# Patient Record
Sex: Male | Born: 1960 | Race: White | Hispanic: No | Marital: Single | Smoking: Never smoker
Health system: Southern US, Community
[De-identification: ages and names within clinical notes are randomized; demographics above are authoritative.]

## PROBLEM LIST (undated history)

## (undated) DIAGNOSIS — E785 Hyperlipidemia, unspecified: Secondary | ICD-10-CM

## (undated) DIAGNOSIS — K219 Gastro-esophageal reflux disease without esophagitis: Secondary | ICD-10-CM

## (undated) DIAGNOSIS — E119 Type 2 diabetes mellitus without complications: Secondary | ICD-10-CM

## (undated) HISTORY — DX: Type 2 diabetes mellitus without complications: E11.9

## (undated) HISTORY — PX: KNEE SURGERY: SHX244

## (undated) HISTORY — DX: Gastro-esophageal reflux disease without esophagitis: K21.9

## (undated) HISTORY — DX: Hyperlipidemia, unspecified: E78.5

---

## 2015-07-19 ENCOUNTER — Ambulatory Visit (INDEPENDENT_AMBULATORY_CARE_PROVIDER_SITE_OTHER)
Admission: RE | Admit: 2015-07-19 | Discharge: 2015-07-19 | Disposition: A | Discharge status: Home / Self Care | Payer: BLUE CROSS/BLUE SHIELD | Source: Ambulatory Visit | Attending: Primary Care | Admitting: Primary Care

## 2015-07-19 ENCOUNTER — Other Ambulatory Visit: Payer: Self-pay | Admitting: Primary Care

## 2015-07-19 ENCOUNTER — Encounter: Payer: Self-pay | Admitting: Primary Care

## 2015-07-19 ENCOUNTER — Ambulatory Visit (INDEPENDENT_AMBULATORY_CARE_PROVIDER_SITE_OTHER): Payer: BLUE CROSS/BLUE SHIELD | Admitting: Primary Care

## 2015-07-19 VITALS — BP 128/70 | HR 96 | Temp 98.0°F | Ht 67.0 in | Wt 171.8 lb

## 2015-07-19 DIAGNOSIS — M5442 Lumbago with sciatica, left side: Secondary | ICD-10-CM

## 2015-07-19 DIAGNOSIS — E119 Type 2 diabetes mellitus without complications: Secondary | ICD-10-CM

## 2015-07-19 DIAGNOSIS — M25559 Pain in unspecified hip: Secondary | ICD-10-CM | POA: Insufficient documentation

## 2015-07-19 DIAGNOSIS — M25552 Pain in left hip: Secondary | ICD-10-CM | POA: Diagnosis not present

## 2015-07-19 DIAGNOSIS — I10 Essential (primary) hypertension: Secondary | ICD-10-CM

## 2015-07-19 DIAGNOSIS — K219 Gastro-esophageal reflux disease without esophagitis: Secondary | ICD-10-CM

## 2015-07-19 DIAGNOSIS — E785 Hyperlipidemia, unspecified: Secondary | ICD-10-CM | POA: Diagnosis not present

## 2015-07-19 LAB — BASIC METABOLIC PANEL
BUN: 19 mg/dL (ref 6–23)
CALCIUM: 10.2 mg/dL (ref 8.4–10.5)
CO2: 27 meq/L (ref 19–32)
CREATININE: 1.06 mg/dL (ref 0.40–1.50)
Chloride: 98 mEq/L (ref 96–112)
GFR: 77.23 mL/min (ref >60.00)
GLUCOSE: 267 mg/dL — AB (ref 70–99)
Potassium: 4.5 mEq/L (ref 3.5–5.1)
Sodium: 135 mEq/L (ref 135–145)

## 2015-07-19 LAB — HEMOGLOBIN A1C: Hgb A1c MFr Bld: 10.1 % — ABNORMAL HIGH (ref 4.6–6.5)

## 2015-07-19 NOTE — Assessment & Plan Note (Signed)
Located to left hip x 8 months. No recent injury. Xray cannot rule out stress fracture as it does show deformity. Will obtain MRI for further evaluation. May need to consider PT.

## 2015-07-19 NOTE — Assessment & Plan Note (Signed)
Long history of, managed on lovastatin. Endorses normal lipid readings 6 months ago by prior PCP.

## 2015-07-19 NOTE — Progress Notes (Signed)
Pre visit review using our clinic review tool, if applicable. No additional management support is needed unless otherwise documented below in the visit note. 

## 2015-07-19 NOTE — Patient Instructions (Addendum)
Complete lab work prior to leaving today. I will notify you of your results once received.   Complete xray(s) prior to leaving today. I will notify you of your results once received.  You must improve your diet which is causing an incresae in your blood sugars.  Limit:  Fast food, sausage biscuits, starchy foods, processed carbohydrates (peanut butter crackers).  Increase:  Vegetables, lean (baked or grilled) protein, salads.  Start exercising. You should be getting 1 hour of moderate intensity exercise 5 days weekly.  It was a pleasure to meet you today! Please don't hesitate to call me with any questions. Welcome to [facility]!  Diabetes Mellitus and Food It is important for you to manage your blood sugar (glucose) level. Your blood glucose level can be greatly affected by what you eat. Eating healthier foods in the appropriate amounts throughout the day at about the same time each day will help you control your blood glucose level. It can also help slow or prevent worsening of your diabetes mellitus. Healthy eating may even help you improve the level of your blood pressure and reach or maintain a healthy weight.  General recommendations for healthful eating and cooking habits include:  Eating meals and snacks regularly. Avoid going long periods of time without eating to lose weight.  Eating a diet that consists mainly of plant-based foods, such as fruits, vegetables, nuts, legumes, and whole grains.  Using low-heat cooking methods, such as baking, instead of high-heat cooking methods, such as deep frying. Work with your dietitian to make sure you understand how to use the Nutrition Facts information on food labels. HOW CAN FOOD AFFECT ME? Carbohydrates Carbohydrates affect your blood glucose level more than any other type of food. Your dietitian will help you determine how many carbohydrates to eat at each meal and teach you how to count carbohydrates. Counting carbohydrates is  important to keep your blood glucose at a healthy level, especially if you are using insulin or taking certain medicines for diabetes mellitus. Alcohol Alcohol can cause sudden decreases in blood glucose (hypoglycemia), especially if you use insulin or take certain medicines for diabetes mellitus. Hypoglycemia can be a life-threatening condition. Symptoms of hypoglycemia (sleepiness, dizziness, and disorientation) are similar to symptoms of having too much alcohol.  If your health care provider has given you approval to drink alcohol, do so in moderation and use the following guidelines:  Women should not have more than one drink per day, and men should not have more than two drinks per day. One drink is equal to:  12 oz of beer.  5 oz of wine.  1 oz of hard liquor.  Do not drink on an empty stomach.  Keep yourself hydrated. Have water, diet soda, or unsweetened iced tea.  Regular soda, juice, and other mixers might contain a lot of carbohydrates and should be counted. WHAT FOODS ARE NOT RECOMMENDED? As you make food choices, it is important to remember that all foods are not the same. Some foods have fewer nutrients per serving than other foods, even though they might have the same number of calories or carbohydrates. It is difficult to get your body what it needs when you eat foods with fewer nutrients. Examples of foods that you should avoid that are high in calories and carbohydrates but low in nutrients include:  Trans fats (most processed foods list trans fats on the Nutrition Facts label).  Regular soda.  Juice.  Candy.  Sweets, such as cake, pie, doughnuts, and cookies.  Fried foods. WHAT FOODS CAN I EAT? Eat nutrient-rich foods, which will nourish your body and keep you healthy. The food you should eat also will depend on several factors, including:  The calories you need.  The medicines you take.  Your weight.  Your blood glucose level.  Your blood pressure  level.  Your cholesterol level. You should eat a variety of foods, including:  Protein.  Lean cuts of meat.  Proteins low in saturated fats, such as fish, egg whites, and beans. Avoid processed meats.  Fruits and vegetables.  Fruits and vegetables that may help control blood glucose levels, such as apples, mangoes, and yams.  Dairy products.  Choose fat-free or low-fat dairy products, such as milk, yogurt, and cheese.  Grains, bread, pasta, and rice.  Choose whole grain products, such as multigrain bread, whole oats, and brown rice. These foods may help control blood pressure.  Fats.  Foods containing healthful fats, such as nuts, avocado, olive oil, canola oil, and fish. DOES EVERYONE WITH DIABETES MELLITUS HAVE THE SAME MEAL PLAN? Because every person with diabetes mellitus is different, there is not one meal plan that works for everyone. It is very important that you meet with a dietitian who will help you create a meal plan that is just right for you.   This information is not intended to replace advice given to you by your health care provider. Make sure you discuss any questions you have with your health care provider.   Document Released: 12/20/2004 Document Revised: 04/15/2014 Document Reviewed: 02/19/2013 Elsevier Interactive Patient Education [employer]2016 Elsevier Inc.

## 2015-07-19 NOTE — Progress Notes (Signed)
Subjective:    Patient ID: Hunter Knox, male    DOB: 07/13/1960, 55 y.o.   MRN: [id number]  HPI  Hunter Knox is a 55 year old male who presents today to establish care and discuss the problems mentioned below. Will obtain old records. His last physical was about 6 months ago.   1) Type 2 Diabetes: Diagnosed at age 67. Currently managed on glyburide-metformin 5-500 mg and takes 2 tablets twice daily. He checks his blood sugar 3-4 times daily and is getting readings of 225-350 within 30 minutes to 1 hour after meals. He was once managed on Januvia and another medication in the same family but both medications are too expensive and he's not taken recently. His last A1C was at 8.4 or 8.7 about 3 months ago. He's noticed visual changes since his sugars have been high. Denies dizziness, chest pain.   He endorses a fair diet which consists of: Breakfast: Sausage biscuit (fast food), fruit Lunch: Soup, sandwich, hamburger Dinner: Meat, sweet potatoes, occasional baked potatoes, salads, some vegetables. Snacks: Occasionally, peanut butter crackers Desserts: Chocolate pudding or treats twice weekly Beverages: Water, coffee with mild  Exercise: He does not currently exercise  2) Hyperlipidemia: Diagnosed several years ago. Currently managed on lovastatin 40 mg. His last cholesterol check was normal several months ago.   3) Essential Hypertension: Currently managed on Trandolapril 4 mg, more so for renal protection from diabetes. BP stable in clinic today.   4) GERD: Long history of and is currently been managed on omeprazole 20 mg. Experiences moderate to severe esophageal burning without his omeprazole 20 mg.   5) Hip and Low Back Pain: Present for the past 7-8 months. More so bothersome to the left lower hip. History of knee surgery many years ago as a teenager and developed a staph infection after his surgery. His back pain is located to the left lower side and endorses intermittent  sciatica. He describes his pain as a "tightness".   He's had no recent imaging in within the last several years. He travels a lot and will sit for prolonged amounts of time. He's used the TENS unit and applying Bengay with temporary improvement. He's not completed physical therapy in the past. Denies recent injury or trauma.  Review of Systems  Constitutional: Negative for unexpected weight change.  HENT: Negative for rhinorrhea.   Eyes: Positive for visual disturbance.  Respiratory: Negative for shortness of breath.   Cardiovascular: Negative for chest pain.  Gastrointestinal:       Moderate GERD symptoms. On PPI with relief.  Endocrine: Negative for polyuria.  Musculoskeletal: Positive for back pain.       Left hip pain  Skin: Negative for rash.  Neurological: Negative for dizziness and headaches.       Past Medical History  Diagnosis Date  . Type 2 diabetes mellitus (HCC)   . Hyperlipidemia   . GERD (gastroesophageal reflux disease)     Social History   Social History  . Marital Status: Single    Spouse Name: N/A  . Number of Children: N/A  . Years of Education: N/A   Occupational History  . Not on file.   Social History Main Topics  . Smoking status: Never Smoker   . Smokeless tobacco: Not on file  . Alcohol Use: No  . Drug Use: No  . Sexual Activity: Not on file   Other Topics Concern  . Not on file   Social History Narrative   Single.  No children.   Works in [employer] as a Production designer, theatre/television/filmmanager.   Enjoys watching college football.        Past Surgical History  Procedure Laterality Date  . Knee surgery Left     age 55  . Knee surgery Right     teenage years    Family History  Problem Relation Age of Onset  . Colon cancer Mother   . Colon cancer Maternal Grandmother   . Colon cancer Maternal Uncle   . Heart attack Mother   . Heart attack Father   . Hypertension Mother   . Diabetes Mother   . Hyperlipidemia Mother     No Known Allergies  No current  outpatient prescriptions on file prior to visit.   No current facility-administered medications on file prior to visit.    BP 128/70 mmHg  Pulse 96  Temp(Src) 98 F (36.7 C) (Oral)  Ht 5\' 7"  (1.702 m)  Wt 171 lb 12.8 oz (77.928 kg)  BMI 26.90 kg/m2  SpO2 96%    Objective:   Physical Exam  Constitutional: He is oriented to person, place, and time. He appears well-nourished.  Neck: Neck supple.  Cardiovascular: Normal rate and regular rhythm.   Pulmonary/Chest: Effort normal and breath sounds normal. He has no wheezes. He has no rales.  Musculoskeletal:       Left hip: He exhibits decreased range of motion. He exhibits normal strength and no tenderness.       Lumbar back: He exhibits pain. He exhibits normal range of motion, no tenderness and no spasm.  Neurological: He is alert and oriented to person, place, and time.  Skin: Skin is warm and dry.  Psychiatric: He has a normal mood and affect.          Assessment & Plan:

## 2015-07-19 NOTE — Assessment & Plan Note (Signed)
Managed on trandolapril 4 mg. BP stable today, continue also for renal protection from diabetes.

## 2015-07-19 NOTE — Assessment & Plan Note (Signed)
Managed on omeprazole 20 mg. Stable. Continue current regimen.

## 2015-07-19 NOTE — Assessment & Plan Note (Signed)
A1C today of 10.1 which is an increase from his reported prior A1C of 8.4. Poor diet for which we discussed today, offered diabetic nutritionist, he will think about this. Will add Invokana 100 mg to his regimen. Renal function stable. Managed on ACE and statin. Follow up in 3 months.

## 2015-07-20 ENCOUNTER — Other Ambulatory Visit: Payer: Self-pay | Admitting: Primary Care

## 2015-07-20 DIAGNOSIS — E119 Type 2 diabetes mellitus without complications: Secondary | ICD-10-CM

## 2015-07-20 MED ORDER — CANAGLIFLOZIN 100 MG PO TABS: 100.0000 mg | ORAL_TABLET | Freq: Every day | ORAL | Status: DC

## 2015-07-21 ENCOUNTER — Other Ambulatory Visit: Payer: Self-pay | Admitting: Primary Care

## 2015-07-21 DIAGNOSIS — M25552 Pain in left hip: Secondary | ICD-10-CM

## 2015-07-24 ENCOUNTER — Telehealth: Payer: Self-pay | Admitting: Primary Care

## 2015-07-24 NOTE — Telephone Encounter (Signed)
Pt called needing prior authorization for medication that was recently called in.  Please call pt back at [phone] Thanks

## 2015-07-25 NOTE — Telephone Encounter (Signed)
Sent prior auth on covermymeds.

## 2015-07-26 NOTE — Telephone Encounter (Signed)
Will you please find out what is covered by his plan? How about Januvia or Onglyza?

## 2015-07-26 NOTE — Telephone Encounter (Signed)
Got noticed that Invokana 100 mg is not cover by insurance plan.

## 2015-07-27 ENCOUNTER — Telehealth: Payer: Self-pay | Admitting: Primary Care

## 2015-07-27 DIAGNOSIS — E119 Type 2 diabetes mellitus without complications: Secondary | ICD-10-CM

## 2015-07-27 MED ORDER — OMEPRAZOLE 20 MG PO CPDR: 20.0000 mg | DELAYED_RELEASE_CAPSULE | Freq: Every day | ORAL | Status: AC

## 2015-07-27 MED ORDER — TRANDOLAPRIL 4 MG PO TABS: 4.0000 mg | ORAL_TABLET | Freq: Every day | ORAL | Status: DC

## 2015-07-27 MED ORDER — LOVASTATIN 40 MG PO TABS: 40.0000 mg | ORAL_TABLET | Freq: Every day | ORAL | Status: DC

## 2015-07-27 MED ORDER — GLYBURIDE-METFORMIN 5-500 MG PO TABS
2.0000 | ORAL_TABLET | Freq: Two times a day (BID) | ORAL | Status: DC
Start: 2015-07-27 — End: 2016-05-21

## 2015-07-27 MED ORDER — SITAGLIPTIN PHOSPHATE 100 MG PO TABS: 100.0000 mg | ORAL_TABLET | Freq: Every day | ORAL | Status: DC

## 2015-07-27 NOTE — Telephone Encounter (Signed)
Please notify Hunter Knox that the [place] was not approved through his insurance. Please also notify him that i've reviewed the accepted medications on his insurance plan and found that Januvia should be covered. I would like to try Januvia and have sent in a prescription. He will take 1 tablet by mouth once daily. If this is too expensive, please have him notify me. We are running low on options for oral medications.

## 2015-07-27 NOTE — Telephone Encounter (Signed)
Called and notified patient of Kate's comments. Patient verbalized understanding.  Patient also request other medication to be refill if possible. Per Jae DireKate, okay to refill.  Sent refill on   glyburide-metformin 5-500 MG tablet  lovastatin 40 MG tablet   omeprazole 20 mg tablet  Trandolapril 4 mg tablet

## 2015-07-27 NOTE — Telephone Encounter (Signed)
Review medication listed on [employer] that is under patient's plan. See info on additional phone on 07/27/15.

## 2015-08-03 ENCOUNTER — Ambulatory Visit (HOSPITAL_COMMUNITY): Payer: BLUE CROSS/BLUE SHIELD

## 2015-08-03 ENCOUNTER — Ambulatory Visit (HOSPITAL_COMMUNITY): Admission: RE | Admit: 2015-08-03 | Payer: BLUE CROSS/BLUE SHIELD | Source: Ambulatory Visit

## 2015-08-23 ENCOUNTER — Other Ambulatory Visit: Payer: Self-pay | Admitting: Primary Care

## 2015-08-23 ENCOUNTER — Ambulatory Visit (HOSPITAL_COMMUNITY)
Admission: RE | Admit: 2015-08-23 | Discharge: 2015-08-23 | Disposition: A | Discharge status: Home / Self Care | Payer: BLUE CROSS/BLUE SHIELD | Source: Ambulatory Visit | Attending: Primary Care | Admitting: Primary Care

## 2015-08-23 DIAGNOSIS — M25552 Pain in left hip: Secondary | ICD-10-CM

## 2015-08-28 ENCOUNTER — Telehealth: Payer: Self-pay | Admitting: Primary Care

## 2015-08-28 NOTE — Telephone Encounter (Signed)
Did Mr. Beauchesne every get the MRI of his hip? If so, then where? I've not yet received a report.

## 2015-08-28 NOTE — Telephone Encounter (Signed)
Message left for patient to return my call.  

## 2015-08-31 NOTE — Telephone Encounter (Signed)
Please returned [name]'s call.  Please call patient back at [phone]. Patient said he's in a bad cell area.  If he doesn't answer,leave a message.

## 2015-09-01 NOTE — Telephone Encounter (Signed)
Place forms in Kate's inbox to be completed.  Also patient stated that the MRI was cancel due to patient not being about lay in the MRI for 45 mintues, possible claustrophobia . Patient was told let his doctor know, patient need to take something before the MRI and need to reschedule. Patient stated that he is off all next week and would like us to schedule the MRI if possible next week.

## 2015-09-01 NOTE — Telephone Encounter (Signed)
Called in valium to [facility].

## 2015-09-01 NOTE — Telephone Encounter (Signed)
Message left for patient to return my call.  

## 2015-09-01 NOTE — Telephone Encounter (Signed)
Pt dropped off forms to be filled out for insurance. Pt requests forms be faxed to [phone]. Please call when completed. I placed forms in Rx tower.

## 2015-09-01 NOTE — Telephone Encounter (Signed)
Please call in valium 5 mg tablets. Take 1 tablet by mouth 45 minutes prior to MRI. May repeat in 1 hour if no reduction in anxiety. #4, no refills. Please have him reschedule his MRI. He should be able to call and reschedule.

## 2015-09-05 ENCOUNTER — Telehealth: Payer: Self-pay | Admitting: Primary Care

## 2015-09-05 DIAGNOSIS — E1165 Type 2 diabetes mellitus with hyperglycemia: Secondary | ICD-10-CM

## 2015-09-05 DIAGNOSIS — E785 Hyperlipidemia, unspecified: Secondary | ICD-10-CM

## 2015-09-05 NOTE — Telephone Encounter (Signed)
Please notify Hunter Knox that in order to complete his form we need additional lab work that was not collected during his initial visit. Typically I would require a complete physical appointment for this, but since he recently established I will go ahead and complete. Please set him up for fasting labs only.

## 2015-09-05 NOTE — Telephone Encounter (Signed)
Spoken to patient and notified him that he can call [facility] to reschedule the MRI. Notified patient that valium was sent to [facility].

## 2015-09-06 NOTE — Telephone Encounter (Signed)
Spoken and notified patient of Kate's comments. Patient verbalized understanding. Lab appt on 09/07/2015

## 2015-09-07 ENCOUNTER — Other Ambulatory Visit (INDEPENDENT_AMBULATORY_CARE_PROVIDER_SITE_OTHER): Payer: BLUE CROSS/BLUE SHIELD

## 2015-09-07 DIAGNOSIS — E785 Hyperlipidemia, unspecified: Secondary | ICD-10-CM | POA: Diagnosis not present

## 2015-09-07 DIAGNOSIS — E1165 Type 2 diabetes mellitus with hyperglycemia: Secondary | ICD-10-CM

## 2015-09-07 LAB — BASIC METABOLIC PANEL
BUN: 22 mg/dL (ref 6–23)
CHLORIDE: 96 meq/L (ref 96–112)
CO2: 29 meq/L (ref 19–32)
Calcium: 9.9 mg/dL (ref 8.4–10.5)
Creatinine, Ser: 1.11 mg/dL (ref 0.40–1.50)
GFR: 73.19 mL/min (ref >60.00)
GLUCOSE: 388 mg/dL — AB (ref 70–99)
Potassium: 4.6 mEq/L (ref 3.5–5.1)
Sodium: 134 mEq/L — ABNORMAL LOW (ref 135–145)

## 2015-09-07 LAB — LDL CHOLESTEROL, DIRECT: LDL DIRECT: 81 mg/dL

## 2015-09-07 LAB — LIPID PANEL
CHOL/HDL RATIO: 3
Cholesterol: 143 mg/dL (ref 0–200)
HDL: 42.7 mg/dL (ref >39.00)
NONHDL: 100.07
Triglycerides: 233 mg/dL — ABNORMAL HIGH (ref 0.0–149.0)
VLDL: 46.6 mg/dL — AB (ref 0.0–40.0)

## 2015-09-08 ENCOUNTER — Encounter: Payer: Self-pay | Admitting: *Deleted

## 2015-09-11 ENCOUNTER — Telehealth: Payer: Self-pay | Admitting: Primary Care

## 2015-09-11 NOTE — Telephone Encounter (Signed)
[facility]  Patient said Johny DrillingChan filled out a health and wellness form for him, but left the dates off for the tests.  Please put dates on form and re-fax form.

## 2015-09-11 NOTE — Telephone Encounter (Signed)
Completed form with dates. Already re-faxed form.

## 2015-09-18 ENCOUNTER — Telehealth: Payer: Self-pay | Admitting: Primary Care

## 2015-09-18 DIAGNOSIS — E119 Type 2 diabetes mellitus without complications: Secondary | ICD-10-CM

## 2015-09-18 DIAGNOSIS — M25552 Pain in left hip: Secondary | ICD-10-CM

## 2015-09-18 NOTE — Telephone Encounter (Signed)
Pt is returning call to chan.   Please call cell number  Thanks

## 2015-09-18 NOTE — Telephone Encounter (Signed)
Pt left v/m; invokana was sent to pharmacy and ins will not cover; pt request [facility] to find out what med ins will pay for and pt request cb. Unable to reach pt by phone.

## 2015-09-20 MED ORDER — CANAGLIFLOZIN 100 MG PO TABS: 100.0000 mg | ORAL_TABLET | Freq: Every day | ORAL | Status: DC

## 2015-09-20 NOTE — Telephone Encounter (Signed)
Spoken to patient on 09/18/2015. When prescribed Invokana, the insurance is not cover this medication so prior auth was done. It was denied and medication was not covered by plan. So Hunter Knox sent Januvia and it was also not cover under the plan.  However, patient did not inform us that he did not pick the Januvia. He did later printed out a coupon for Invokana and had that prescription filled. When he tried to refill the Invokana again, the insurance denied it. So patient called office.  Called the insurance on 09/19/2015.  One of medication that Health Plan preferred products are jardiance and synjardry (combo).

## 2015-09-20 NOTE — Telephone Encounter (Signed)
Spoken and notified patient of Kate's comments. Patient verbalized understanding. 

## 2015-09-20 NOTE — Telephone Encounter (Signed)
Coupon card ready for pick up in our office. I sent refills of Invokana to his pharmacy. If this does not work then we MUST try another medication on his list.

## 2015-09-28 NOTE — Telephone Encounter (Signed)
Patient called to get an update on the prior authorization.  Patient said the Invokana card didn't work.  Patient would,also,like to have an [facility].  Patient said he wasn't able to do the closed MRI. Please return patient's call.

## 2015-09-29 MED ORDER — SITAGLIPTIN PHOSPHATE 100 MG PO TABS: 100.0000 mg | ORAL_TABLET | Freq: Every day | ORAL | Status: DC

## 2015-09-29 MED ORDER — INSULIN GLARGINE 100 UNIT/ML SOLOSTAR PEN: 15.0000 [IU] | PEN_INJECTOR | Freq: Every day | SUBCUTANEOUS | Status: DC

## 2015-09-29 NOTE — Telephone Encounter (Signed)
Patient says that once, years ago, he was on Lantus and then [place] said he thought he could be controlled on the oral medication.  Now, of course, his medication is not being covered by his insurance.  He is currently taking the Glyburide/Metformin 5/500 only.  Patient asks that an Rx for Lantus be sent to his pharmacy.  Patient also reminds you of his request for the open MRI.

## 2015-09-29 NOTE — Telephone Encounter (Signed)
Also am going to start with 15 units for now and gradually increase him. Does he have a glucometer, test strips, lancets? If not will you please order? He will need to check his blood sugar twice daily before meals, record his readings we will call for these readings in 2 weeks.

## 2015-09-29 NOTE — Telephone Encounter (Signed)
Also patient would like to have open MRI to be done [facility].

## 2015-09-29 NOTE — Addendum Note (Signed)
Addended by: Tawnya CrookSAMBATH, Sukaina Toothaker on: 09/29/2015 02:45 PM   Modules accepted: Orders, Medications

## 2015-09-29 NOTE — Telephone Encounter (Signed)
Called and spoken with patient's insurance. Was told that Invokana was not cover under plan. They suggested PA for Invokana and did another PA over the phone, it was denial again. I was told to try Januvia. So later on the day, I called [facility] and they informed me that [name] is covered but it will cost the patient $418.00.

## 2015-09-29 NOTE — Addendum Note (Signed)
Addended by: Doreene NestLARK, Miyana Mordecai K on: 09/29/2015 04:39 PM   Modules accepted: Orders

## 2015-09-29 NOTE — Telephone Encounter (Deleted)
Called and spoken to

## 2015-09-29 NOTE — Addendum Note (Signed)
Addended by: Doreene NestLARK, Nikiah Goin K on: 09/29/2015 04:03 PM   Modules accepted: Orders

## 2015-09-29 NOTE — Telephone Encounter (Signed)
Prescription for Lantus sent to pharmacy. He is to inject 15 units at bedtime. Will you also order pen caps for Lantus pen? Please also advised him that I sent a separate note stating that his open MRI has been ordered.

## 2015-09-29 NOTE — Telephone Encounter (Signed)
At this point this leaves patient with the option of injectable medications. Please explain to him everything you have done to try to get both Januvia and Invokana approved. There is no other option for pills at this point so my next move will be injectable medication. Please do a complete med rec to see what he is currently taking for diabetes at home.

## 2015-10-02 MED ORDER — INSULIN PEN NEEDLE 32G X 8 MM MISC: Status: DC

## 2015-10-02 NOTE — Telephone Encounter (Signed)
Spoken and notified patient of Kate's comments. Patient verbalized understanding. 

## 2015-10-02 NOTE — Addendum Note (Signed)
Addended by: Tawnya CrookSAMBATH, Yelena Metzer on: 10/02/2015 10:42 AM   Modules accepted: Orders

## 2015-10-02 NOTE — Telephone Encounter (Signed)
Message left for patient to return my call.  

## 2015-10-11 ENCOUNTER — Ambulatory Visit
Admission: RE | Admit: 2015-10-11 | Discharge: 2015-10-11 | Disposition: A | Discharge status: Home / Self Care | Payer: BLUE CROSS/BLUE SHIELD | Source: Ambulatory Visit | Attending: Primary Care | Admitting: Primary Care

## 2015-10-11 DIAGNOSIS — M25552 Pain in left hip: Secondary | ICD-10-CM

## 2015-10-11 MED ORDER — GADOBENATE DIMEGLUMINE 529 MG/ML IV SOLN
15.0000 mL | Freq: Once | INTRAVENOUS | Status: AC | PRN
  Administered 2015-10-11: 15 mL via INTRAVENOUS

## 2015-10-13 ENCOUNTER — Telehealth: Payer: Self-pay | Admitting: Primary Care

## 2015-10-13 NOTE — Telephone Encounter (Signed)
Message left for patient to return my call.  

## 2015-10-13 NOTE — Telephone Encounter (Signed)
Patient returned [name]'s call. °

## 2015-10-13 NOTE — Telephone Encounter (Signed)
Patient returned [name]'s call.  Please call patient back at [phone].

## 2015-10-13 NOTE — Telephone Encounter (Signed)
Tried to call patient again on the other [phone]

## 2015-10-16 NOTE — Telephone Encounter (Signed)
Please result note on MRI.

## 2015-10-17 ENCOUNTER — Telehealth: Payer: Self-pay | Admitting: Primary Care

## 2015-10-17 NOTE — Telephone Encounter (Signed)
Attempted to call patient, voice mail left for him to return call at his convenience. [name], please contact patient at the number located below and read my notes from the result note. His MRI shows the congenital deformity and is otherwise normal. He needs follow-up in the office in October for diabetes and other medical conditions, please schedule. Also, what are his blood sugars running?

## 2015-10-17 NOTE — Telephone Encounter (Signed)
Pt is requesting a call back regarding his mri that was done.  He prefers you to call [phone]  Thank you

## 2015-10-18 NOTE — Telephone Encounter (Signed)
Message left for patient to return my call.  

## 2015-10-18 NOTE — Telephone Encounter (Signed)
Patient returned Kate's call.  Please call patient back at [phone].

## 2015-10-20 NOTE — Telephone Encounter (Signed)
Patient called this morning and requesting if possible for Hunter Knox to call him on Monday after 4:30 pm. Patient will be at a place where he should have good signal. Patient wanted to talk to Hunter Knox and did not want me to relay the information on the result note for the MRI.

## 2015-10-24 NOTE — Telephone Encounter (Signed)
Pt returned call again concerning MRI results. He is requesting a cb by 5pm today.

## 2015-10-24 NOTE — Telephone Encounter (Signed)
Spoke with patient and reviewed with MRI results. Recommended physical therapy for continued hip pain. He believes he will be transferred to another job in another state in several weeks and will call me back regarding his decision for physical therapy.

## 2015-11-14 ENCOUNTER — Other Ambulatory Visit: Payer: Self-pay | Admitting: Primary Care

## 2015-11-14 DIAGNOSIS — E119 Type 2 diabetes mellitus without complications: Secondary | ICD-10-CM

## 2015-11-14 NOTE — Telephone Encounter (Signed)
Received faxed refill request for Lantus Solostar pen.  Last prescribed on 09/29/2015.  Also pharmacy stated, "patient states they are injecting up to 30 units twice daily. Please send a new script with new directions."

## 2015-11-15 ENCOUNTER — Telehealth: Payer: Self-pay | Admitting: Primary Care

## 2015-11-15 NOTE — Telephone Encounter (Signed)
Message left for patient to return my call.  

## 2015-11-15 NOTE — Telephone Encounter (Signed)
Please call patient and clarify how often and how many units he's injecting his Lantus. If he's injecting Lantus twice daily, then why? This is a once daily medication. Also, he needs follow up in the office in October. Please schedule.

## 2015-11-20 MED ORDER — INSULIN GLARGINE 100 UNIT/ML SOLOSTAR PEN
30.0000 [IU] | PEN_INJECTOR | Freq: Two times a day (BID) | SUBCUTANEOUS | 3 refills | Status: DC
Start: 2015-11-20 — End: 2015-12-26

## 2015-11-20 NOTE — Telephone Encounter (Signed)
Message left for patient to return my call.  

## 2015-11-20 NOTE — Telephone Encounter (Signed)
Spoken to patient. Patient stated that he has been taking before Lantus BID. Patient would like to keep taking BID.  Follow up on 01/18/2016.

## 2015-11-20 NOTE — Telephone Encounter (Signed)
Noted.  Refill sent to pharmacy. 

## 2015-12-20 ENCOUNTER — Telehealth: Payer: Self-pay | Admitting: Primary Care

## 2015-12-20 DIAGNOSIS — E785 Hyperlipidemia, unspecified: Secondary | ICD-10-CM

## 2015-12-20 DIAGNOSIS — E119 Type 2 diabetes mellitus without complications: Secondary | ICD-10-CM

## 2015-12-20 NOTE — Telephone Encounter (Signed)
Pt called stating he needed to get biometic labs again for work.  He stated this was the same as the one he had on 09/14/15 Ok to schedule??

## 2015-12-21 NOTE — Telephone Encounter (Signed)
Appointment 9/15 pt aware

## 2015-12-21 NOTE — Telephone Encounter (Signed)
Okay to schedule. Labs ordered

## 2015-12-22 ENCOUNTER — Telehealth: Payer: Self-pay | Admitting: Primary Care

## 2015-12-22 ENCOUNTER — Other Ambulatory Visit (INDEPENDENT_AMBULATORY_CARE_PROVIDER_SITE_OTHER): Payer: BLUE CROSS/BLUE SHIELD

## 2015-12-22 DIAGNOSIS — Z1211 Encounter for screening for malignant neoplasm of colon: Secondary | ICD-10-CM

## 2015-12-22 DIAGNOSIS — E785 Hyperlipidemia, unspecified: Secondary | ICD-10-CM

## 2015-12-22 DIAGNOSIS — E119 Type 2 diabetes mellitus without complications: Secondary | ICD-10-CM

## 2015-12-22 LAB — COMPREHENSIVE METABOLIC PANEL
ALBUMIN: 4.3 g/dL (ref 3.5–5.2)
ALK PHOS: 43 U/L (ref 39–117)
ALT: 49 U/L (ref 0–53)
AST: 32 U/L (ref 0–37)
BUN: 16 mg/dL (ref 6–23)
CALCIUM: 10.1 mg/dL (ref 8.4–10.5)
CO2: 31 meq/L (ref 19–32)
CREATININE: 1.18 mg/dL (ref 0.40–1.50)
Chloride: 100 mEq/L (ref 96–112)
GFR: 68.13 mL/min (ref >60.00)
Glucose, Bld: 117 mg/dL — ABNORMAL HIGH (ref 70–99)
Potassium: 4.5 mEq/L (ref 3.5–5.1)
Sodium: 137 mEq/L (ref 135–145)
Total Bilirubin: 0.3 mg/dL (ref 0.2–1.2)
Total Protein: 7.8 g/dL (ref 6.0–8.3)

## 2015-12-22 LAB — MICROALBUMIN / CREATININE URINE RATIO
Creatinine,U: 43.9 mg/dL
Microalb Creat Ratio: 1.6 mg/g (ref 0.0–30.0)
Microalb, Ur: <0.7 mg/dL (ref 0.0–1.9)

## 2015-12-22 LAB — LIPID PANEL
CHOL/HDL RATIO: 3
CHOLESTEROL: 146 mg/dL (ref 0–200)
HDL: 48.8 mg/dL (ref >39.00)
LDL CALC: 80 mg/dL (ref 0–99)
NonHDL: 97.22
TRIGLYCERIDES: 88 mg/dL (ref 0.0–149.0)
VLDL: 17.6 mg/dL (ref 0.0–40.0)

## 2015-12-22 LAB — HEMOGLOBIN A1C: Hgb A1c MFr Bld: 8.3 % — ABNORMAL HIGH (ref 4.6–6.5)

## 2015-12-22 NOTE — Telephone Encounter (Signed)
Pt dropped off screening form for ins to be filled out for the labs completed today in office. He asked for it to be faxed [phone] when completed. Also, he dropped a blank form for 3 mos when due again. I placed in Rx tower.

## 2015-12-22 NOTE — Telephone Encounter (Signed)
Place form in Kate's inbox. 

## 2015-12-22 NOTE — Telephone Encounter (Signed)
Form completed and placed in [name]'s inbox. 

## 2015-12-26 ENCOUNTER — Encounter: Payer: Self-pay | Admitting: Primary Care

## 2015-12-26 MED ORDER — INSULIN GLARGINE 100 UNIT/ML SOLOSTAR PEN: 32.0000 [IU] | PEN_INJECTOR | Freq: Two times a day (BID) | SUBCUTANEOUS | 3 refills | Status: DC

## 2015-12-26 NOTE — Telephone Encounter (Signed)
Patient called back.Notified patient of Kate's comments. Patient verbalized understanding.  

## 2015-12-26 NOTE — Telephone Encounter (Signed)
Noted. Referral placed to GI for colonoscopy. Please notify patient someone will be in contact with him soon.

## 2015-12-26 NOTE — Telephone Encounter (Signed)
Patient stated that he is due for a colonoscopy and like a referral to have it done. Patient have family history and would like to get it done.

## 2015-12-26 NOTE — Telephone Encounter (Signed)
Message left for patient to return my call.  

## 2015-12-26 NOTE — Addendum Note (Signed)
Addended by: Tawnya CrookSAMBATH, Aundre Hietala on: 12/26/2015 10:33 AM   Modules accepted: Orders

## 2015-12-26 NOTE — Telephone Encounter (Signed)
Faxed form to patient's work on 12/25/2015.  Message left for patient to return my call regarding lab results.

## 2015-12-27 ENCOUNTER — Encounter: Payer: Self-pay | Admitting: Gastroenterology

## 2015-12-27 NOTE — Telephone Encounter (Signed)
Spoken and notified patient of Kate's comments. Patient verbalized understanding. 

## 2016-01-18 ENCOUNTER — Telehealth: Payer: Self-pay | Admitting: Primary Care

## 2016-01-18 ENCOUNTER — Ambulatory Visit: Payer: BLUE CROSS/BLUE SHIELD | Admitting: Primary Care

## 2016-01-18 NOTE — Telephone Encounter (Signed)
Patient did not come in for their appointment today for follow up.  Please let me know if patient needs to be contacted immediately for follow up or no follow up needed. °

## 2016-01-18 NOTE — Telephone Encounter (Signed)
Please reschedule patient at his earliest convenience. I would like to see him soon.

## 2016-01-19 NOTE — Telephone Encounter (Signed)
Pt scheduled for 10/18 Pt aware

## 2016-01-24 ENCOUNTER — Ambulatory Visit: Payer: BLUE CROSS/BLUE SHIELD | Admitting: Primary Care

## 2016-02-01 ENCOUNTER — Encounter: Payer: BLUE CROSS/BLUE SHIELD | Admitting: Gastroenterology

## 2016-02-07 ENCOUNTER — Other Ambulatory Visit: Payer: Self-pay | Admitting: Primary Care

## 2016-02-21 ENCOUNTER — Encounter: Payer: Self-pay | Admitting: Primary Care

## 2016-03-05 ENCOUNTER — Encounter: Payer: BLUE CROSS/BLUE SHIELD | Admitting: Gastroenterology

## 2016-03-19 ENCOUNTER — Telehealth: Payer: Self-pay | Admitting: *Deleted

## 2016-03-19 NOTE — Telephone Encounter (Signed)
Invokana required PA. Completed and faxed. Will await determination.

## 2016-05-21 ENCOUNTER — Encounter: Payer: Self-pay | Admitting: Primary Care

## 2016-05-21 ENCOUNTER — Ambulatory Visit (INDEPENDENT_AMBULATORY_CARE_PROVIDER_SITE_OTHER): Payer: BLUE CROSS/BLUE SHIELD | Admitting: Primary Care

## 2016-05-21 VITALS — BP 146/94 | HR 70 | Temp 98.0°F | Ht 67.0 in | Wt 192.8 lb

## 2016-05-21 DIAGNOSIS — E785 Hyperlipidemia, unspecified: Secondary | ICD-10-CM | POA: Diagnosis not present

## 2016-05-21 DIAGNOSIS — E119 Type 2 diabetes mellitus without complications: Secondary | ICD-10-CM

## 2016-05-21 DIAGNOSIS — I10 Essential (primary) hypertension: Secondary | ICD-10-CM | POA: Diagnosis not present

## 2016-05-21 DIAGNOSIS — Z1211 Encounter for screening for malignant neoplasm of colon: Secondary | ICD-10-CM | POA: Diagnosis not present

## 2016-05-21 DIAGNOSIS — K219 Gastro-esophageal reflux disease without esophagitis: Secondary | ICD-10-CM

## 2016-05-21 LAB — HEMOGLOBIN A1C: HEMOGLOBIN A1C: 8.5 % — AB (ref 4.6–6.5)

## 2016-05-21 MED ORDER — LOVASTATIN 40 MG PO TABS: 40.0000 mg | ORAL_TABLET | Freq: Every day | ORAL | 3 refills | Status: DC

## 2016-05-21 MED ORDER — TRANDOLAPRIL 4 MG PO TABS: 4.0000 mg | ORAL_TABLET | Freq: Every day | ORAL | 3 refills | Status: DC

## 2016-05-21 MED ORDER — INSULIN GLARGINE 100 UNIT/ML SOLOSTAR PEN: PEN_INJECTOR | SUBCUTANEOUS | 3 refills | Status: DC

## 2016-05-21 NOTE — Progress Notes (Signed)
Pre visit review using our clinic review tool, if applicable. No additional management support is needed unless otherwise documented below in the visit note. 

## 2016-05-21 NOTE — Patient Instructions (Signed)
Complete lab work prior to leaving today. I will notify you of your results once received.   I sent refills of your medications to your pharmacy in [place].  Continue to adjust your insulin as discussed.  You will be contacted regarding your referral to GI for the colonoscopy.  Please let us know if you have not heard back within one week.   Follow up in 6 months for re-evaluation.  It was a pleasure to see you today!

## 2016-05-21 NOTE — Progress Notes (Signed)
Subjective:    Patient ID: Hunter Knox, male    DOB: 06/18/1960, 56 y.o.   MRN: [id number]  HPI  [name] is a 56 year old male who presents today for follow up.  1) Type 2 Diabetes: Currently managed on Lantus 32 units BID. He was once managed on glyburide-metformin 5/500 (2 tablets) twice daily, and invokana 100 mg but he cannot afford. His last A1C was 8.3 in September 2017.   He's checking his sugars 3-4 times daily (fasting, before meals, at bedtime). His sugars fasting and 2 hours after meals are running 60's-180's. He's adjusted his insulin dose when he notices his sugars are too low.   2) Hyperlipidemia: Currently managed on lovastatin 40 mg. Lipids in September 2017 stable. He denies myalgias.   3) Essential Hypertension: Currently managed on trandolapril 4 mg. He's been out of his Mavik 3-4 days ago. His BP today is above goal. He does not check his BP at home. He denies chest pain, visual changes, dizziness.  Review of Systems  Constitutional: Negative for fatigue.  Eyes: Negative for visual disturbance.  Respiratory: Negative for shortness of breath.   Cardiovascular: Negative for chest pain.  Neurological: Negative for dizziness and numbness.       Past Medical History:  Diagnosis Date  . GERD (gastroesophageal reflux disease)   . Hyperlipidemia   . Type 2 diabetes mellitus (HCC)      Social History   Social History  . Marital status: Single    Spouse name: N/A  . Number of children: N/A  . Years of education: N/A   Occupational History  . Not on file.   Social History Main Topics  . Smoking status: Never Smoker  . Smokeless tobacco: Never Used  . Alcohol use No  . Drug use: No  . Sexual activity: Not on file   Other Topics Concern  . Not on file   Social History Narrative   Single.   No children.   Works in [employer] as a Production designer, theatre/television/filmmanager.   Enjoys watching college football.        Past Surgical History:  Procedure Laterality Date  . KNEE  SURGERY Left    age 56  . KNEE SURGERY Right    teenage years    Family History  Problem Relation Age of Onset  . Colon cancer Mother   . Heart attack Mother   . Hypertension Mother   . Diabetes Mother   . Hyperlipidemia Mother   . Colon cancer Maternal Grandmother   . Colon cancer Maternal Uncle   . Heart attack Father     No Known Allergies  Current Outpatient Prescriptions on File Prior to Visit  Medication Sig Dispense Refill  . aspirin 81 MG tablet Take 81 mg by mouth daily.    . Insulin Pen Needle 32G X 8 MM MISC Use to inject 15 units at betime. 100 each 11  . Multiple Vitamin (MULTIVITAMIN) tablet Take 1 tablet by mouth daily.    Marland Kitchen. omeprazole (PRILOSEC) 20 MG capsule Take 1 capsule (20 mg total) by mouth daily. 30 capsule 3   No current facility-administered medications on file prior to visit.     BP (!) 146/94   Pulse 70   Temp 98 F (36.7 C) (Oral)   Ht 5\' 7"  (1.702 m)   Wt 192 lb 12.8 oz (87.5 kg)   SpO2 98%   BMI 30.20 kg/m    Objective:   Physical Exam  Constitutional:  He appears well-nourished.  Neck: Neck supple.  Cardiovascular: Normal rate and regular rhythm.   Pulmonary/Chest: Effort normal and breath sounds normal.  Skin: Skin is warm and dry.          Assessment & Plan:

## 2016-05-21 NOTE — Assessment & Plan Note (Signed)
Lipids in September stable, due for recheck in 6 months. Refills provided.

## 2016-05-21 NOTE — Assessment & Plan Note (Signed)
Stable on brand Prilosec. Discussed trigger foods.

## 2016-05-21 NOTE — Assessment & Plan Note (Signed)
Slightly above goal today, out of meds for 3 days, refills sent. BMP up to date.

## 2016-05-21 NOTE — Assessment & Plan Note (Signed)
Cannot afford tablets, uses insulin twice daily, adjusts accordingly. Sugars overall seem stable. Refill of Lantus provided. A1C pending today. Managed on Statin and ACE.  Follow up in 6 months.

## 2016-05-22 ENCOUNTER — Other Ambulatory Visit: Payer: Self-pay | Admitting: Primary Care

## 2016-05-22 DIAGNOSIS — Z794 Long term (current) use of insulin: Principal | ICD-10-CM

## 2016-05-22 DIAGNOSIS — E119 Type 2 diabetes mellitus without complications: Secondary | ICD-10-CM

## 2016-05-22 MED ORDER — METFORMIN HCL 1000 MG PO TABS
1000.0000 mg | ORAL_TABLET | Freq: Two times a day (BID) | ORAL | 1 refills | Status: DC
Start: 2016-05-22 — End: 2016-11-29

## 2016-07-04 ENCOUNTER — Telehealth: Payer: Self-pay | Admitting: Primary Care

## 2016-07-04 NOTE — Telephone Encounter (Signed)
Patient called and said he need a [employer] form filled out today.  Patient faxed form. Form is in Kate's In box.

## 2016-07-04 NOTE — Telephone Encounter (Signed)
Faxed form to patient to [phone] as requested.  Message left for patient to return my call.

## 2016-07-04 NOTE — Telephone Encounter (Signed)
Noted and completed. Looks like he needs to sign and complete the top section. FYI to patient. Please also remind him he needs to give use several days advance notice in the future.

## 2016-10-17 ENCOUNTER — Ambulatory Visit (INDEPENDENT_AMBULATORY_CARE_PROVIDER_SITE_OTHER): Payer: BLUE CROSS/BLUE SHIELD | Admitting: Primary Care

## 2016-10-17 ENCOUNTER — Encounter: Payer: Self-pay | Admitting: Primary Care

## 2016-10-17 VITALS — BP 162/98 | HR 93 | Temp 98.5°F | Ht 67.0 in | Wt 193.4 lb

## 2016-10-17 DIAGNOSIS — Z794 Long term (current) use of insulin: Secondary | ICD-10-CM

## 2016-10-17 DIAGNOSIS — R079 Chest pain, unspecified: Secondary | ICD-10-CM | POA: Diagnosis not present

## 2016-10-17 DIAGNOSIS — I10 Essential (primary) hypertension: Secondary | ICD-10-CM

## 2016-10-17 DIAGNOSIS — K219 Gastro-esophageal reflux disease without esophagitis: Secondary | ICD-10-CM | POA: Diagnosis not present

## 2016-10-17 DIAGNOSIS — E119 Type 2 diabetes mellitus without complications: Secondary | ICD-10-CM | POA: Diagnosis not present

## 2016-10-17 LAB — HEMOGLOBIN A1C: Hgb A1c MFr Bld: 8.3 % — ABNORMAL HIGH (ref 4.6–6.5)

## 2016-10-17 MED ORDER — AMLODIPINE BESYLATE 10 MG PO TABS: 10.0000 mg | ORAL_TABLET | Freq: Every day | ORAL | 0 refills | Status: DC

## 2016-10-17 MED ORDER — INSULIN GLARGINE 100 UNIT/ML SOLOSTAR PEN: PEN_INJECTOR | SUBCUTANEOUS | 3 refills | Status: DC

## 2016-10-17 NOTE — Patient Instructions (Addendum)
Complete lab work prior to leaving today.   Your back pain is likely muscular. Try stretching, application of heat/ice, application of Icy/Hot creams, Ibuprofen.   Start Amlodipine 10 mg tablets for high blood pressure. Take 1 tablet by mouth once daily.  We will be in touch once we receive your lab results.  It was a pleasure to see you today!

## 2016-10-17 NOTE — Assessment & Plan Note (Signed)
Above goal today, also on last visit. Home readings above goal. Continue trandolapril, add Amlodipine 10 mg once daily. Will have him monitor BP readings and return in 2-3 weeks for BP check.

## 2016-10-17 NOTE — Progress Notes (Signed)
Subjective:    Patient ID: Hunter Knox, male    DOB: Aug 12, 1960, 56 y.o.   MRN: [id number]  HPI  Hunter Knox is a 56 year old male who presents today with a chief complaint of back pain and for follow up.  1) Back Pain: Located to the left thoracic back at the top of his scapula. He describes this pain as burning/numbness/tingling. His pain was worse when laying down or applying pressure to the back. This started about 1 week ago. Several days later the pain radiated around to the left anterior chest. He denies nausea, diaphoresis, rashes. His chest is also tender upon palpation. Overall his pain has improved. This has occurred within the last one year with 2-3 episodes total.   2) Essential Hypertension: Currently managed on trandolapril 4 mg. His BP in the clinic today is 162/98. He's been checking his BP at home over the past 1 week and is getting readings of 135-150's/80's.   BP Readings from Last 3 Encounters:  10/17/16 (!) 162/98  05/21/16 (!) 146/94  07/19/15 128/70     3) Hyperlipidemia: Currently managed on lovastatin 40 mg. His last lipid panel was stable in September 2017. He is not fasting today.  4) Type 2 Diabetes: He is currently managed on Lantus. He will inject 20-22 units in the morning and 30-32 units in the evening. He's also managed on Metformin 1000 mg BID. His last A1C was 8.5 in February 2018.   He's checking his blood sugars several times daily. Fasting blood sugars are 70's-80's. Twice he's woken up during the night with numbers in the 60's that improved with a snack. Two-Three hours after lunch he's running 140-150's. Pre dinner sugars are 120's-130's.   Review of Systems  Constitutional: Negative for diaphoresis and fever.  Respiratory: Negative for cough and shortness of breath.   Cardiovascular: Positive for chest pain.  Gastrointestinal: Negative for nausea.  Musculoskeletal: Positive for back pain and myalgias.  Allergic/Immunologic:  Positive for environmental allergies.  Neurological: Negative for dizziness and headaches.       Past Medical History:  Diagnosis Date  . GERD (gastroesophageal reflux disease)   . Hyperlipidemia   . Type 2 diabetes mellitus (HCC)      Social History   Social History  . Marital status: Single    Spouse name: N/A  . Number of children: N/A  . Years of education: N/A   Occupational History  . Not on file.   Social History Main Topics  . Smoking status: Never Smoker  . Smokeless tobacco: Never Used  . Alcohol use No  . Drug use: No  . Sexual activity: Not on file   Other Topics Concern  . Not on file   Social History Narrative   Single.   No children.   Works in [employer] as a Production designer, theatre/television/film.   Enjoys watching college football.        Past Surgical History:  Procedure Laterality Date  . KNEE SURGERY Left    age 48  . KNEE SURGERY Right    teenage years    Family History  Problem Relation Age of Onset  . Colon cancer Mother   . Heart attack Mother   . Hypertension Mother   . Diabetes Mother   . Hyperlipidemia Mother   . Colon cancer Maternal Grandmother   . Colon cancer Maternal Uncle   . Heart attack Father     No Known Allergies  Current Outpatient Prescriptions on File Prior  to Visit  Medication Sig Dispense Refill  . aspirin 81 MG tablet Take 81 mg by mouth daily.    . Insulin Pen Needle 32G X 8 MM MISC Use to inject 15 units at betime. 100 each 11  . lovastatin (MEVACOR) 40 MG tablet Take 1 tablet (40 mg total) by mouth at bedtime. 90 tablet 3  . metFORMIN (GLUCOPHAGE) 1000 MG tablet Take 1 tablet (1,000 mg total) by mouth 2 (two) times daily with a meal. 180 tablet 1  . Multiple Vitamin (MULTIVITAMIN) tablet Take 1 tablet by mouth daily.    Marland Kitchen. omeprazole (PRILOSEC) 20 MG capsule Take 1 capsule (20 mg total) by mouth daily. 30 capsule 3  . trandolapril (MAVIK) 4 MG tablet Take 1 tablet (4 mg total) by mouth daily. 90 tablet 3   No current  facility-administered medications on file prior to visit.     BP (!) 162/98   Pulse 93   Temp 98.5 F (36.9 C) (Oral)   Ht 5\' 7"  (1.702 m)   Wt 193 lb 6.4 oz (87.7 kg)   SpO2 97%   BMI 30.29 kg/m    Objective:   Physical Exam  Constitutional: He appears well-nourished.  Neck: Neck supple.  Cardiovascular: Normal rate and regular rhythm.   Pulmonary/Chest: Effort normal and breath sounds normal.    No tenderness today.  Musculoskeletal:       Thoracic back: He exhibits normal range of motion, no tenderness, no bony tenderness, no pain and no spasm.       Back:  Skin: Skin is warm and dry.          Assessment & Plan:  Back Pain:  Located to left upper thoracic back x 1 week, overall better. Has experienced this pain 1-2 times within the last year. Now with radiation to left chest. No evidence of shingles on exam. Suspect MSK in origin, especially given aggravation with laying down/pressure. No suspicion for PE. ECG: with NSR rate of 87, no ST elevation, no t-wave inversion. Will treat with conservative measures including Ibuprofen, topical creams, stretching.  Morrie Sheldonlark,Jyasia Markoff Kendal, NP

## 2016-10-17 NOTE — Assessment & Plan Note (Signed)
Overall home blood glucose readings seem stable. Discussed to lower night time dose of Lantus if he continues to notice drops in sugars, he verbalized understanding. Continue Metformin. Check A1C today.

## 2016-10-17 NOTE — Assessment & Plan Note (Signed)
Doing well on brand Prilosec.

## 2016-10-23 ENCOUNTER — Other Ambulatory Visit: Payer: Self-pay | Admitting: Primary Care

## 2016-10-23 DIAGNOSIS — E119 Type 2 diabetes mellitus without complications: Secondary | ICD-10-CM

## 2016-10-23 MED ORDER — INSULIN GLARGINE 100 UNIT/ML SOLOSTAR PEN: PEN_INJECTOR | SUBCUTANEOUS | 3 refills | Status: AC

## 2016-10-23 MED ORDER — INSULIN ASPART 100 UNIT/ML FLEXPEN: PEN_INJECTOR | SUBCUTANEOUS | 11 refills | Status: DC

## 2016-10-25 ENCOUNTER — Telehealth: Payer: Self-pay | Admitting: Primary Care

## 2016-10-25 DIAGNOSIS — Z794 Long term (current) use of insulin: Principal | ICD-10-CM

## 2016-10-25 DIAGNOSIS — E119 Type 2 diabetes mellitus without complications: Secondary | ICD-10-CM

## 2016-10-25 MED ORDER — INSULIN LISPRO 100 UNIT/ML (KWIKPEN): PEN_INJECTOR | SUBCUTANEOUS | 11 refills | Status: AC

## 2016-10-25 NOTE — Telephone Encounter (Signed)
Message left for patient to return my call.  

## 2016-10-25 NOTE — Telephone Encounter (Signed)
Received a fax from [facility] that Novolog Flexpen is not on patient formulary.   Please advise.

## 2016-10-25 NOTE — Telephone Encounter (Signed)
Noted, Humalog pen sent to pharmacy. Please update patient.

## 2016-10-28 MED ORDER — INSULIN PEN NEEDLE 32G X 8 MM MISC: 5 refills | Status: AC

## 2016-10-30 NOTE — Telephone Encounter (Signed)
Message left for patient to return my call.  

## 2016-11-07 ENCOUNTER — Telehealth: Payer: Self-pay | Admitting: Primary Care

## 2016-11-07 ENCOUNTER — Ambulatory Visit: Payer: BLUE CROSS/BLUE SHIELD | Admitting: Primary Care

## 2016-11-07 DIAGNOSIS — Z0289 Encounter for other administrative examinations: Secondary | ICD-10-CM

## 2016-11-07 NOTE — Telephone Encounter (Signed)
Patient did not come in for their appointment today for 3 week follow up. Please let me know if patient needs to be contacted immediately for follow up or no follow up needed. Do you want to charge the NSF? °

## 2016-11-07 NOTE — Telephone Encounter (Signed)
Yes, please reschedule. No fee.

## 2016-11-29 ENCOUNTER — Other Ambulatory Visit: Payer: Self-pay

## 2016-11-29 DIAGNOSIS — E119 Type 2 diabetes mellitus without complications: Secondary | ICD-10-CM

## 2016-11-29 DIAGNOSIS — I10 Essential (primary) hypertension: Secondary | ICD-10-CM

## 2016-11-29 DIAGNOSIS — Z794 Long term (current) use of insulin: Principal | ICD-10-CM

## 2016-11-29 MED ORDER — METFORMIN HCL 1000 MG PO TABS: 1000.0000 mg | ORAL_TABLET | Freq: Two times a day (BID) | ORAL | 1 refills | Status: DC

## 2016-11-29 MED ORDER — AMLODIPINE BESYLATE 10 MG PO TABS: 10.0000 mg | ORAL_TABLET | Freq: Every day | ORAL | 0 refills | Status: DC

## 2016-11-29 NOTE — Telephone Encounter (Signed)
What are his blood pressures running since we added in Amlodipine?  Will need to see him in the office in mid October for repeat A1C, blood pressure and diabetes follow up. Please schedule.

## 2016-11-29 NOTE — Telephone Encounter (Signed)
Pt left v/m; pt last seen 10/17/16 and was to return 2-3 wks; pt has not had time to come in and pt request refill amlodipine and metformin. Pt request [facility]. [facility] [place].

## 2016-12-02 NOTE — Telephone Encounter (Addendum)
Spoken and notified patient of Kate's comments. Patient verbalized understanding.  Patient stated that his BP has been running 117s over 70s.  Follow up on 01/31/2017

## 2016-12-02 NOTE — Telephone Encounter (Signed)
Noted  

## 2016-12-06 ENCOUNTER — Other Ambulatory Visit: Payer: Self-pay | Admitting: *Deleted

## 2017-01-31 ENCOUNTER — Ambulatory Visit: Payer: BLUE CROSS/BLUE SHIELD | Admitting: Primary Care

## 2017-02-21 ENCOUNTER — Other Ambulatory Visit: Payer: Self-pay | Admitting: Primary Care

## 2017-02-21 DIAGNOSIS — I10 Essential (primary) hypertension: Secondary | ICD-10-CM

## 2017-05-15 ENCOUNTER — Other Ambulatory Visit: Payer: Self-pay | Admitting: Primary Care

## 2017-05-15 DIAGNOSIS — I10 Essential (primary) hypertension: Secondary | ICD-10-CM

## 2017-05-15 DIAGNOSIS — E119 Type 2 diabetes mellitus without complications: Secondary | ICD-10-CM

## 2017-05-15 DIAGNOSIS — Z794 Long term (current) use of insulin: Principal | ICD-10-CM

## 2017-05-25 ENCOUNTER — Other Ambulatory Visit: Payer: Self-pay | Admitting: Primary Care

## 2017-05-25 DIAGNOSIS — E785 Hyperlipidemia, unspecified: Secondary | ICD-10-CM

## 2017-06-03 ENCOUNTER — Other Ambulatory Visit: Payer: Self-pay | Admitting: Primary Care

## 2017-06-03 DIAGNOSIS — I10 Essential (primary) hypertension: Secondary | ICD-10-CM

## 2017-06-04 NOTE — Telephone Encounter (Signed)
Ok to refill? Electronically refill request for amLODipine (NORVASC) 10 MG tablet  Last prescribed on 02/21/2017. Last seen on 10/17/2016

## 2017-06-04 NOTE — Telephone Encounter (Signed)
Please call patient and check on blood pressure readings.  He is also overdue for diabetes follow-up, please schedule. Has he been taking amlodipine since July 2018?

## 2017-06-06 NOTE — Telephone Encounter (Signed)
Message left for patient to return my call.  

## 2017-06-10 NOTE — Telephone Encounter (Signed)
Message left for patient to return my call.  

## 2017-06-16 MED ORDER — AMLODIPINE BESYLATE 10 MG PO TABS: 10.0000 mg | ORAL_TABLET | Freq: Every day | ORAL | 0 refills | Status: AC

## 2017-06-16 NOTE — Telephone Encounter (Signed)
Will send refill, please send letter notifying patient that he's due for follow up.

## 2017-06-17 NOTE — Telephone Encounter (Signed)
Letter sent.

## 2017-11-13 IMAGING — DX DG LUMBAR SPINE COMPLETE 4+V
5 series · 5 of 5 positions shown · non-contrast
Comparison: None.

CLINICAL DATA: 54 year old male with left lower back and hip pain x
surgery in 20's; no priors

EXAM:
LUMBAR SPINE - COMPLETE 4+ VIEW

[l-spine ap]
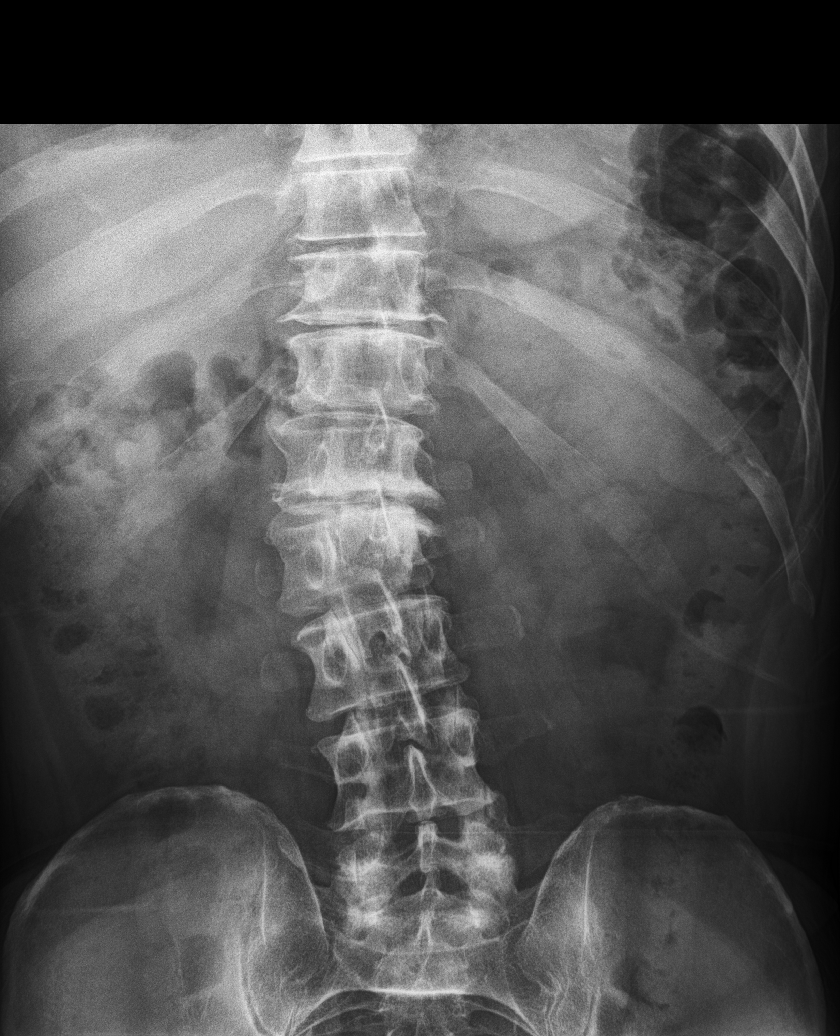

[l-spine obl (1 of 2)]
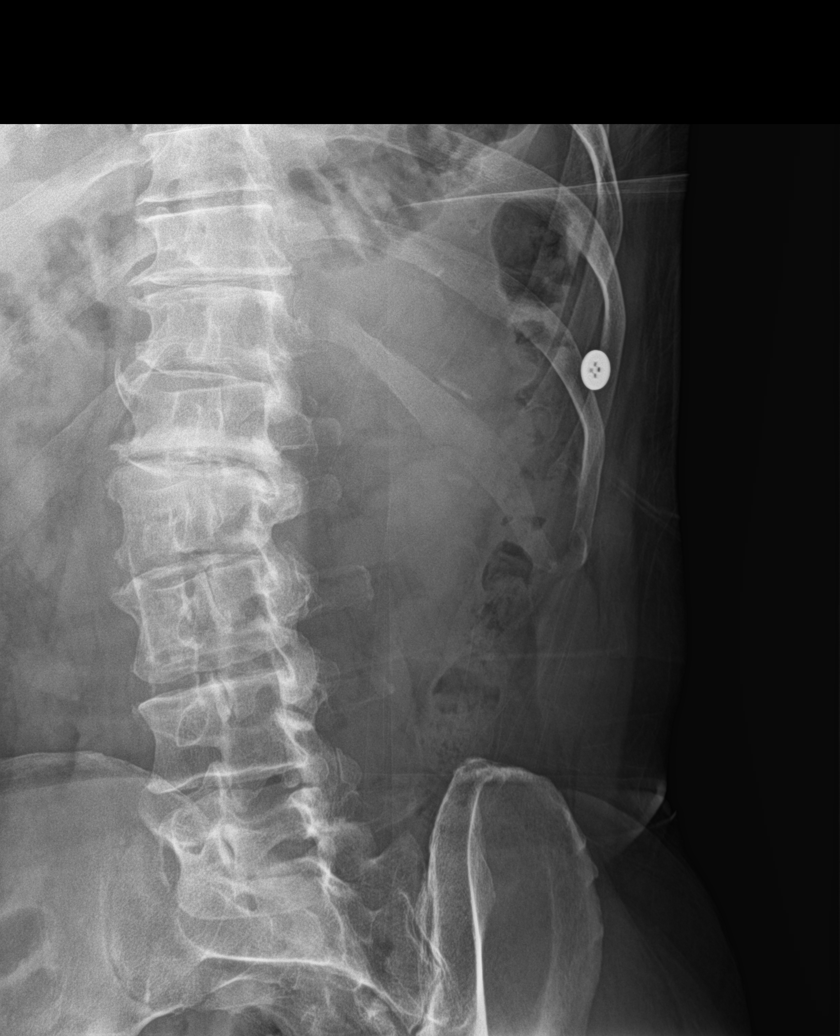

[l-spine obl (2 of 2)]
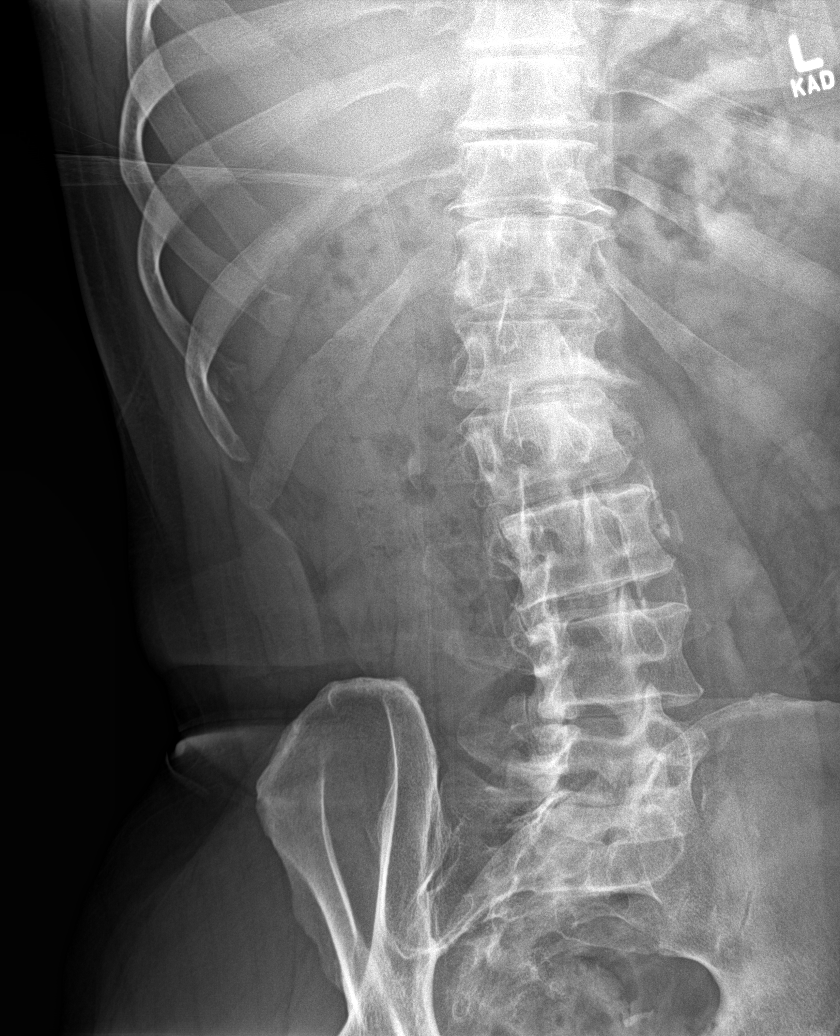

[l-spine lat]
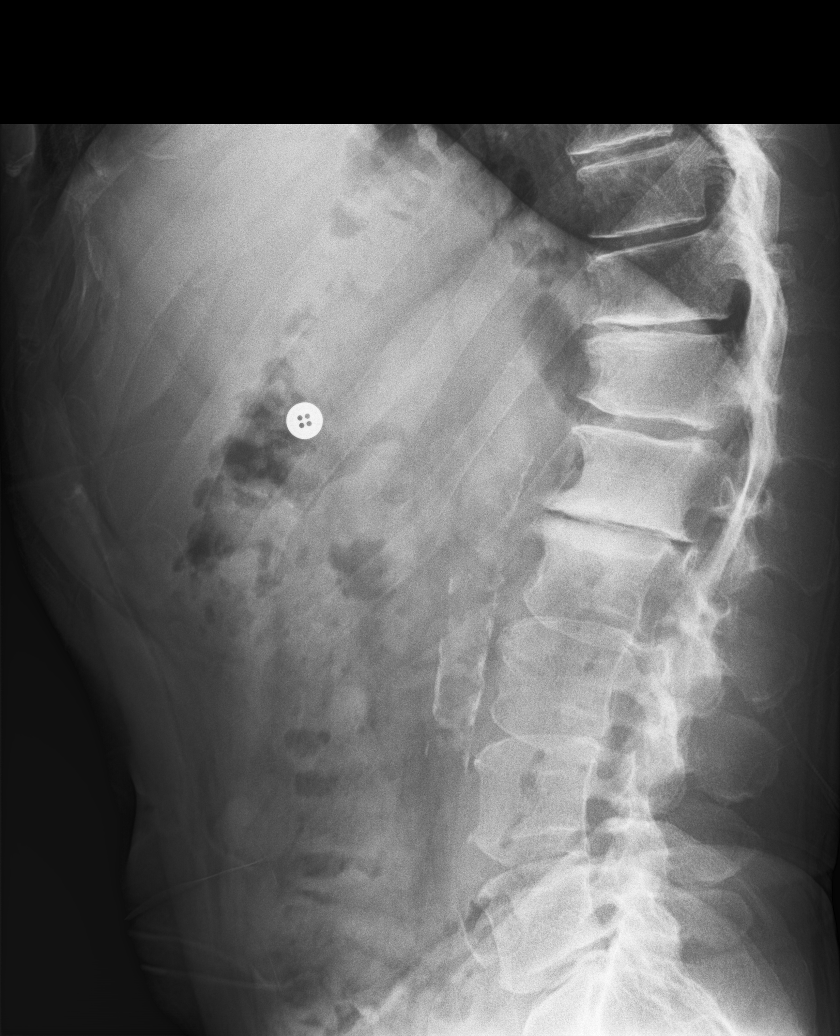

[l-spine l5/s1]
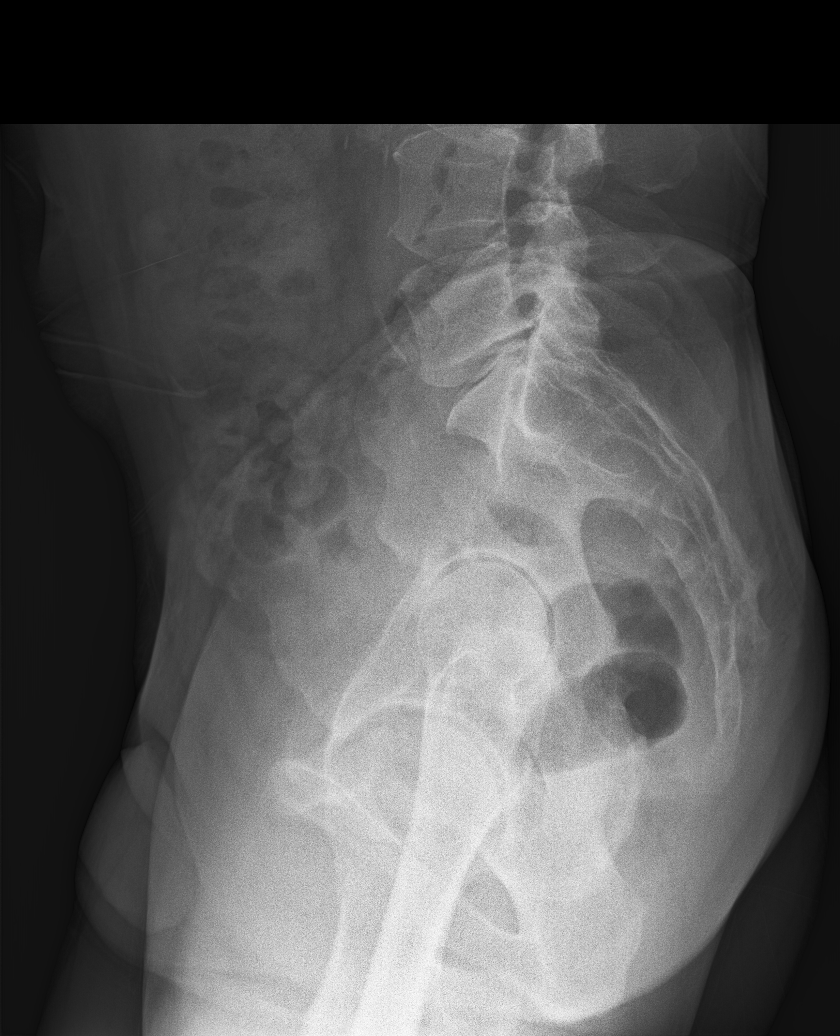

[5 of 5 positions shown; findings below may reference images not displayed]

FINDINGS: No fracture or spondylolisthesis.

Mild dextroscoliosis the apex at L1-L2.

Moderate loss of disc height at L1-L2. Mild loss disc height in the
lower thoracic spine and at L4-L5 and L5-S1.

Mild facet joint narrowing noted in the right at L2-L3 and on the
left at T12-L1 and L1-L2.

Bones are demineralized.

Soft tissues are unremarkable.
IMPRESSION: 1. No fracture or acute finding.
2. Degenerative changes and dextroscoliosis as detailed.
